# Patient Record
Sex: Female | Born: 2000 | Race: White | Hispanic: No | Marital: Single | Smoking: Never smoker
Health system: Southern US, Community
[De-identification: ages and names within clinical notes are randomized; demographics above are authoritative.]

## PROBLEM LIST (undated history)

## (undated) DIAGNOSIS — T7840XA Allergy, unspecified, initial encounter: Secondary | ICD-10-CM

## (undated) HISTORY — PX: WISDOM TOOTH EXTRACTION: SHX21

## (undated) HISTORY — DX: Allergy, unspecified, initial encounter: T78.40XA

---
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
## 2020-02-17 ENCOUNTER — Ambulatory Visit: Payer: BC Managed Care – PPO | Admitting: Adult Health

## 2021-08-29 ENCOUNTER — Ambulatory Visit
Admission: RE | Admit: 2021-08-29 | Discharge: 2021-08-29 | Disposition: A | Discharge status: Home / Self Care | Payer: BC Managed Care – PPO | Source: Ambulatory Visit | Attending: Pediatrics | Admitting: Pediatrics

## 2021-08-29 ENCOUNTER — Other Ambulatory Visit: Payer: Self-pay | Admitting: Pediatrics

## 2021-08-29 DIAGNOSIS — T1490XA Injury, unspecified, initial encounter: Secondary | ICD-10-CM | POA: Diagnosis present

## 2021-08-29 DIAGNOSIS — R1084 Generalized abdominal pain: Secondary | ICD-10-CM

## 2021-10-12 ENCOUNTER — Emergency Department
Admission: EM | Admit: 2021-10-12 | Discharge: 2021-10-13 | Disposition: A | Discharge status: Home / Self Care | Payer: 59 | Attending: Emergency Medicine | Admitting: Emergency Medicine

## 2021-10-12 ENCOUNTER — Emergency Department: Payer: 59

## 2021-10-12 ENCOUNTER — Encounter: Payer: Self-pay | Admitting: *Deleted

## 2021-10-12 ENCOUNTER — Other Ambulatory Visit: Payer: Self-pay

## 2021-10-12 DIAGNOSIS — R1011 Right upper quadrant pain: Secondary | ICD-10-CM | POA: Diagnosis present

## 2021-10-12 DIAGNOSIS — K529 Noninfective gastroenteritis and colitis, unspecified: Secondary | ICD-10-CM | POA: Insufficient documentation

## 2021-10-12 DIAGNOSIS — E876 Hypokalemia: Secondary | ICD-10-CM | POA: Diagnosis not present

## 2021-10-12 LAB — COMPREHENSIVE METABOLIC PANEL
ALT: 9 U/L (ref 0–44)
AST: 17 U/L (ref 15–41)
Albumin: 4.2 g/dL (ref 3.5–5.0)
Alkaline Phosphatase: 69 U/L (ref 38–126)
Anion gap: 7 (ref 5–15)
BUN: 9 mg/dL (ref 6–20)
CO2: 26 mmol/L (ref 22–32)
Calcium: 9.1 mg/dL (ref 8.9–10.3)
Chloride: 107 mmol/L (ref 98–111)
Creatinine, Ser: 0.77 mg/dL (ref 0.44–1.00)
GFR, Estimated: >60 mL/min (ref >60)
Glucose, Bld: 111 mg/dL — ABNORMAL HIGH (ref 70–99)
Potassium: 3 mmol/L — ABNORMAL LOW (ref 3.5–5.1)
Sodium: 140 mmol/L (ref 135–145)
Total Bilirubin: 0.5 mg/dL (ref 0.3–1.2)
Total Protein: 7.4 g/dL (ref 6.5–8.1)

## 2021-10-12 LAB — CBC WITH DIFFERENTIAL/PLATELET
Abs Immature Granulocytes: 0.03 10*3/uL (ref 0.00–0.07)
Basophils Absolute: 0.1 10*3/uL (ref 0.0–0.1)
Basophils Relative: 1 %
Eosinophils Absolute: 0.1 10*3/uL (ref 0.0–0.5)
Eosinophils Relative: 1 %
HCT: 39.8 % (ref 36.0–46.0)
Hemoglobin: 12.9 g/dL (ref 12.0–15.0)
Immature Granulocytes: 0 %
Lymphocytes Relative: 38 %
Lymphs Abs: 3.7 10*3/uL (ref 0.7–4.0)
MCH: 28.8 pg (ref 26.0–34.0)
MCHC: 32.4 g/dL (ref 30.0–36.0)
MCV: 88.8 fL (ref 80.0–100.0)
Monocytes Absolute: 1.1 10*3/uL — ABNORMAL HIGH (ref 0.1–1.0)
Monocytes Relative: 12 %
Neutro Abs: 4.7 10*3/uL (ref 1.7–7.7)
Neutrophils Relative %: 48 %
Platelets: 308 10*3/uL (ref 150–400)
RBC: 4.48 MIL/uL (ref 3.87–5.11)
RDW: 11.1 % — ABNORMAL LOW (ref 11.5–15.5)
WBC: 9.7 10*3/uL (ref 4.0–10.5)
nRBC: 0 % (ref 0.0–0.2)

## 2021-10-12 LAB — LIPASE, BLOOD: Lipase: 35 U/L (ref 11–51)

## 2021-10-12 LAB — URINALYSIS, ROUTINE W REFLEX MICROSCOPIC
Bilirubin Urine: NEGATIVE
Glucose, UA: NEGATIVE mg/dL
Hgb urine dipstick: NEGATIVE
Ketones, ur: NEGATIVE mg/dL
Leukocytes,Ua: NEGATIVE
Nitrite: NEGATIVE
Protein, ur: NEGATIVE mg/dL
Specific Gravity, Urine: 1.009 (ref 1.005–1.030)
pH: 6 (ref 5.0–8.0)

## 2021-10-12 LAB — POC URINE PREG, ED: Preg Test, Ur: NEGATIVE

## 2021-10-12 LAB — PREGNANCY, URINE: Preg Test, Ur: NEGATIVE

## 2021-10-12 MED ORDER — FAMOTIDINE IN NACL 20-0.9 MG/50ML-% IV SOLN
20.0000 mg | Freq: Once | INTRAVENOUS | Status: AC
  Administered 2021-10-12: 20 mg via INTRAVENOUS
  Filled 2021-10-12: qty 50

## 2021-10-12 MED ORDER — SODIUM CHLORIDE 0.9 % IV BOLUS
1000.0000 mL | Freq: Once | INTRAVENOUS | Status: AC
  Administered 2021-10-12: 1000 mL via INTRAVENOUS

## 2021-10-12 NOTE — ED Provider Notes (Signed)
[facility] Provider Note    Event Date/Time   First MD Initiated Contact with Patient 10/12/21 2300     (approximate)   History   Abdominal Pain   HPI  Melissa Whitehead is a 21 y.o. female who presents to the ER from home literally like accompanied by her mother with a chief complaint.  Patient works right upper quadrant abdominal pain, intermittently x2 weeks.  Describes sharp pain rib cage.  Not associated with food, nausea, vomiting.  Notes 3 episodes of diarrhea this week.  Denies fever, cough, dysuria, vaginal bleeding.  Had a collision at taekwondo several weeks ago where she was in her upper abdomen.  Denies recent travel.     Past Medical History  No past medical history on file.   Active Problem List  There are no problems to display for this patient.    Past Surgical History  None   Home Medications   Prior to Admission medications   Not on File     Allergies  Sevoflurane   Family History  No family history on file.   Physical Exam  Triage Vital Signs: ED Triage Vitals  Enc Vitals Group     BP 10/12/21 1905 113/74     Pulse Rate 10/12/21 1905 72     Resp 10/12/21 1905 20     Temp 10/12/21 1905 98.2 F (36.8 C)     Temp Source 10/12/21 1905 Oral     SpO2 10/12/21 1905 100 %     Weight 10/12/21 1906 135 lb (61.2 kg)     Height 10/12/21 1906 5\' 2"  (1.575 m)     Head Circumference --      Peak Flow --      Pain Score 10/12/21 1906 1     Pain Loc --      Pain Edu? --      Excl. in GC? --     Updated Vital Signs: BP 94/60   Pulse 75   Temp 98.6 F (37 C)   Resp 16   Ht 5\' 2"  (1.575 m)   Wt 61.2 kg   LMP 09/28/2021 (Approximate)   SpO2 98%   BMI 24.69 kg/m    General: Awake, no distress.  CV:  RRR. Good peripheral perfusion.  Resp:  Normal effort.  CTA B. Abd:  Minimal tenderness to palpation right upper quadrant without rebound or guarding. no distention.  Other:  No truncal vesicles.   ED Results  / Procedures / Treatments  Labs (all labs ordered are listed, but only abnormal results are displayed) Labs Reviewed  CBC WITH DIFFERENTIAL/PLATELET - Abnormal; Notable for the following components:      Result Value   RDW 11.1 (*)    Monocytes Absolute 1.1 (*)    All other components within normal limits  COMPREHENSIVE METABOLIC PANEL - Abnormal; Notable for the following components:   Potassium 3.0 (*)    Glucose, Bld 111 (*)    All other components within normal limits  URINALYSIS, ROUTINE W REFLEX MICROSCOPIC - Abnormal; Notable for the following components:   Color, Urine YELLOW (*)    APPearance HAZY (*)    All other components within normal limits  LIPASE, BLOOD  PREGNANCY, URINE  POC URINE PREG, ED     EKG  None   RADIOLOGY I have independently visualized and interpreted patient's ultrasound and CT scans as well as the radiology interpretation:  Ultrasound: Unremarkable  CTA chest: No PE  CT abdomen/pelvis: Enteritis  Official radiology report(s): CT Abdomen Pelvis W Contrast  Result Date: 10/13/2021 CLINICAL DATA:  Insert epic diagnosis and indication EXAM: CT ABDOMEN AND PELVIS WITH CONTRAST TECHNIQUE: Multidetector CT imaging of the abdomen and pelvis was performed using the standard protocol following bolus administration of intravenous contrast. RADIATION DOSE REDUCTION: This exam was performed according to the departmental dose-optimization program which includes automated exposure control, adjustment of the mA and/or kV according to patient size and/or use of iterative reconstruction technique. CONTRAST:  OMNIPAQUE IOHEXOL 350 MG/ML SOLN COMPARISON:  Right upper quadrant ultrasound yesterday. FINDINGS: Lower chest: Assessed on concurrent chest CTA. Hepatobiliary: No focal liver abnormality is seen. No gallstones, gallbladder wall thickening, or biliary dilatation. Pancreas: No ductal dilatation or inflammation. Spleen: Normal in size without focal  abnormality. Adrenals/Urinary Tract: Normal adrenal glands. No hydronephrosis, focal renal abnormality or stone. Minimally distended urinary bladder, equivocal wall thickening versus nondistention. Stomach/Bowel: Stomach is within normal limits. Appendix appears normal. Scattered fluid within small bowel with occasional areas of wall thickening. No perienteric inflammation or edema. Small to moderate volume of colonic stool. Vascular/Lymphatic: No acute vascular findings. Normal caliber abdominal aorta. Patent portal vein. There are multiple prominent ileocolic lymph nodes measuring up to 8 mm short axis. Reproductive: Uterus and bilateral adnexa are unremarkable. Other: No free air, free fluid, or intra-abdominal fluid collection. Musculoskeletal: There are no acute or suspicious osseous abnormalities. Mild scoliosis. IMPRESSION: 1. Scattered fluid within small bowel with occasional areas of wall thickening, suggesting enteritis. 2. Multiple prominent ileocolic lymph nodes are likely reactive. 3. Equivocal bladder wall thickening versus nondistention. Electronically Signed   By: Narda Rutherford M.D.   On: 10/13/2021 01:10   CT Angio Chest PE W/Cm &/Or Wo Cm  Result Date: 10/13/2021 CLINICAL DATA:  Pulmonary embolism (PE) suspected, unknown D-dimer EXAM: CT ANGIOGRAPHY CHEST WITH CONTRAST TECHNIQUE: Multidetector CT imaging of the chest was performed using the standard protocol during bolus administration of intravenous contrast. Multiplanar CT image reconstructions and MIPs were obtained to evaluate the vascular anatomy. Performed in conjunction with CT of the abdomen and pelvis. RADIATION DOSE REDUCTION: This exam was performed according to the departmental dose-optimization program which includes automated exposure control, adjustment of the mA and/or kV according to patient size and/or use of iterative reconstruction technique. CONTRAST:  OMNIPAQUE IOHEXOL 350 MG/ML SOLN COMPARISON:  Chest radiograph  08/29/2021, no interval chest imaging. FINDINGS: Cardiovascular: There are no filling defects within the pulmonary arteries to suggest pulmonary embolus. No aortic dissection or acute aortic findings. Normal heart size. No pericardial effusion. Mediastinum/Nodes: No mediastinal adenopathy or mass. Residual thymus in the anterior mediastinum, not unexpected for age. The esophagus is decompressed. No visible thyroid nodule. Lungs/Pleura: Clear lungs. No focal airspace disease. No pleural effusion. Upper Abdomen: Assessed on concurrent abdominal CT, reported separately. Musculoskeletal: There are no acute or suspicious osseous abnormalities. Mild thoracic scoliosis. No chest wall soft tissue abnormalities. Review of the MIP images confirms the above findings. IMPRESSION: No pulmonary embolus or acute intrathoracic abnormality. Electronically Signed   By: Narda Rutherford M.D.   On: 10/13/2021 01:04   US Abdomen Limited RUQ (LIVER/GB)  Result Date: 10/12/2021 CLINICAL DATA:  [id number] EXAM: ULTRASOUND ABDOMEN LIMITED RIGHT UPPER QUADRANT COMPARISON:  None Available. FINDINGS: Gallbladder: No gallstones or wall thickening visualized. No sonographic Murphy sign noted by sonographer. Common bile duct: Diameter: Visualized portion measures 2 mm, within normal limits Liver: No focal lesion identified. Within normal limits in parenchymal echogenicity. Portal vein is  patent on color Doppler imaging with normal direction of blood flow towards the liver. Other: None. IMPRESSION: Unremarkable RIGHT upper quadrant ultrasound. Electronically Signed   By: Valentino Saxon M.D.   On: 10/12/2021 19:37     PROCEDURES:  Critical Care performed: No  Procedures   MEDICATIONS ORDERED IN ED: Medications  potassium chloride SA (KLOR-CON M) CR tablet 40 mEq (has no administration in time range)  ciprofloxacin (CIPRO) tablet 500 mg (has no administration in time range)  sodium chloride 0.9 % bolus 1,000 mL (0 mLs Intravenous  Stopped 10/13/21 0031)  famotidine (PEPCID) IVPB 20 mg premix (0 mg Intravenous Stopped 10/13/21 0017)  iohexol (OMNIPAQUE) 350 MG/ML injection 100 mL (100 mLs Intravenous Contrast Given 10/13/21 0049)     IMPRESSION / MDM / ASSESSMENT AND PLAN / ED COURSE  I reviewed the triage vital signs and the nursing notes.                             21 year old female presenting with a 2-week history of right upper quadrant abdominal pain. Differential diagnosis includes, but is not limited to, biliary disease (biliary colic, acute cholecystitis, cholangitis, choledocholithiasis, etc), intrathoracic causes for epigastric abdominal pain including ACS, gastritis, duodenitis, pancreatitis, small bowel or large bowel obstruction, abdominal aortic aneurysm, hernia, and ulcer(s).   Patient's presentation is most consistent with acute presentation with potential threat to life or bodily function.  Laboratory results demonstrate normal WBC 9.7, mild hypokalemia potassium 3.0, normal LFTs/lipase, negative urine.  Ultrasound is unremarkable.  Will obtain CTA chest to evaluate for PE and CT abdomen/pelvis to evaluate for intra-abdominal etiology.  Will reassess.  Clinical Course as of 10/13/21 [id number]  Nancy Fetter Oct 13, 2021  0225 Patient resting no acute distress.  Updated patient and mother on negative CTA for PE.  CT abdomen/pelvis demonstrates enteritis.  We discussed these findings and agreed to place patient on a 3-day course of Cipro bid.  Strict return precautions given.  Both verbalized understanding agree with plan of care. [JS]    Clinical Course User Index [JS] Paulette Blanch, MD     FINAL CLINICAL IMPRESSION(S) / ED DIAGNOSES   Final diagnoses:  Right upper quadrant abdominal pain  Hypokalemia  Enteritis     Rx / DC Orders   ED Discharge Orders     None        Note:  This document was prepared using Dragon voice recognition software and may include unintentional dictation errors.   Paulette Blanch, MD 10/13/21 [phone]

## 2021-10-12 NOTE — ED Notes (Signed)
ED Provider at bedside. 

## 2021-10-12 NOTE — ED Provider Triage Note (Signed)
Emergency Medicine Provider Triage Evaluation Note  Melissa Whitehead , a 21 y.o. female  was evaluated in triage.  Pt complains of RUQ pain x2 weeks, it was intermittent, and now constant today. Describes as mild, and worse with truncal rotation. Denies any change in pain with eating.   Review of Systems  Positive: Abd pain,  Negative: N/v/d, fevers  Physical Exam  There were no vitals taken for this visit. Gen:   Awake, no distress   Resp:  Normal effort  MSK:   Moves extremities without difficulty  Other:    Medical Decision Making  Medically screening exam initiated at 7:04 PM.  Appropriate orders placed.  Melissa Whitehead was informed that the remainder of the evaluation will be completed by another provider, this initial triage assessment does not replace that evaluation, and the importance of remaining in the ED until their evaluation is complete.     Jackelyn Hoehn, PA-C 10/12/21 1907

## 2021-10-12 NOTE — ED Triage Notes (Addendum)
Pt has ruq pain for 2 weeks.  Diarrhea x 3 this week  no vomiting.  No back pain .  Denies urinary sx.  No vag bleeding.   Pt alert  speech clear.

## 2021-10-13 ENCOUNTER — Emergency Department: Payer: 59

## 2021-10-13 MED ORDER — POTASSIUM CHLORIDE CRYS ER 20 MEQ PO TBCR
40.0000 meq | EXTENDED_RELEASE_TABLET | Freq: Once | ORAL | Status: AC
  Administered 2021-10-13: 40 meq via ORAL
  Filled 2021-10-13: qty 2

## 2021-10-13 MED ORDER — IOHEXOL 350 MG/ML SOLN
100.0000 mL | Freq: Once | INTRAVENOUS | Status: AC | PRN
  Administered 2021-10-13: 100 mL via INTRAVENOUS

## 2021-10-13 MED ORDER — CIPROFLOXACIN HCL 500 MG PO TABS: 500.0000 mg | ORAL_TABLET | Freq: Two times a day (BID) | ORAL | 0 refills | Status: AC

## 2021-10-13 MED ORDER — CIPROFLOXACIN HCL 500 MG PO TABS
500.0000 mg | ORAL_TABLET | Freq: Once | ORAL | Status: AC
  Administered 2021-10-13: 500 mg via ORAL
  Filled 2021-10-13: qty 1

## 2021-10-13 NOTE — Discharge Instructions (Signed)
1.  Take antibiotic as prescribed (Cipro 500mg  twice daily x3 days). 2.  BRAT diet x3 days, then slowly advance diet as tolerated. 3.  Return to the ER for worsening symptoms, persistent pain, difficulty breathing or other concerns.

## 2021-10-13 NOTE — ED Notes (Signed)
ED Provider at bedside. 

## 2021-10-13 NOTE — ED Notes (Signed)
Pt taken to radiology

## 2022-07-17 ENCOUNTER — Ambulatory Visit: Payer: 59 | Admitting: Family Medicine

## 2022-07-17 ENCOUNTER — Encounter: Payer: Self-pay | Admitting: Family Medicine

## 2022-07-17 ENCOUNTER — Ambulatory Visit: Payer: Self-pay | Admitting: Family Medicine

## 2022-07-17 VITALS — BP 98/67 | HR 71 | Temp 98.7°F | Resp 14 | Ht 62.0 in | Wt 133.2 lb

## 2022-07-17 DIAGNOSIS — G43109 Migraine with aura, not intractable, without status migrainosus: Secondary | ICD-10-CM | POA: Diagnosis not present

## 2022-07-17 DIAGNOSIS — Z7689 Persons encountering health services in other specified circumstances: Secondary | ICD-10-CM | POA: Diagnosis not present

## 2022-07-17 DIAGNOSIS — G8929 Other chronic pain: Secondary | ICD-10-CM

## 2022-07-17 DIAGNOSIS — M25531 Pain in right wrist: Secondary | ICD-10-CM | POA: Diagnosis not present

## 2022-07-17 MED ORDER — BACLOFEN 10 MG PO TABS: 10.0000 mg | ORAL_TABLET | ORAL | 5 refills | Status: DC | PRN

## 2022-07-17 MED ORDER — BACLOFEN 10 MG PO TABS: 10.0000 mg | ORAL_TABLET | ORAL | 5 refills | Status: AC | PRN

## 2022-07-17 NOTE — Progress Notes (Signed)
I,Vanessa  Vital,acting as a Neurosurgeon for Jacky Kindle, FNP.,have documented all relevant documentation on the behalf of Jacky Kindle, FNP,as directed by  Jacky Kindle, FNP while in the presence of Jacky Kindle, FNP.   New patient visit  Patient: Melissa Whitehead   DOB: Nov 20, 2000   22 y.o. Female  MRN: [id number] Visit Date: 07/17/2022  Today's healthcare provider: Jacky Kindle, FNP  Patient presents for new patient visit to establish care.  Introduced to Publishing rights manager role and practice setting.  All questions answered.  Discussed provider/patient relationship and expectations.  Subjective    Melissa Whitehead is a 22 y.o. female who presents today as a new patient to establish care.   HPI  Patient transferring care from [facility] Patient states would like discuss pain in her wrist and migraines she has been experiencing.  Past Medical History:  Diagnosis Date   Allergy     Family Status  Relation Name Status   Mother  Alive   Father  Alive   MGM  Alive   MGF  Alive   PGM  Deceased   PGF  Alive   Family History  Problem Relation Age of Onset   Diverticulosis Mother    Stroke Father    Stroke Maternal Grandmother    Parkinson's disease Maternal Grandfather    Heart attack Maternal Grandfather    Diabetes Paternal Grandmother    Kidney failure Paternal Grandmother    Cancer Paternal Grandmother    Alzheimer's disease Paternal Grandfather    Social History   Socioeconomic History   Marital status: Single    Spouse name: Not on file   Number of children: Not on file   Years of education: Not on file   Highest education level: Not on file  Occupational History   Not on file  Tobacco Use   Smoking status: Never   Smokeless tobacco: Never  Substance and Sexual Activity   Alcohol use: Yes    Comment: occasional   Drug use: Never   Sexual activity: Not Currently    Birth control/protection: None  Other Topics Concern   Not on file   Social History Narrative   Not on file   Social Determinants of Health   Financial Resource Strain: Not on file  Food Insecurity: Not on file  Transportation Needs: Not on file  Physical Activity: Not on file  Stress: Not on file  Social Connections: Not on file   Outpatient Medications Prior to Visit  Medication Sig   meclizine (ANTIVERT) 25 MG tablet Take 25 mg by mouth as needed (Only when experiencing vertigo symptoms).   [DISCONTINUED] baclofen (LIORESAL) 10 MG tablet Take 1 tablet by mouth as needed (ADHD).   [DISCONTINUED] lisdexamfetamine (VYVANSE) 20 MG capsule Take 1 capsule by mouth every morning. As needed   No facility-administered medications prior to visit.   Allergies  Allergen Reactions   Sevoflurane Other (See Comments)    Other reaction(s): Unknown Dad had malignant hyperthermia with this drug.     There is no immunization history on file for this patient.  Health Maintenance  Topic Date Due   COVID-19 Vaccine (1) Never done   HPV VACCINES (1 - 2-dose series) Never done   HIV Screening  Never done   Hepatitis C Screening  Never done   DTaP/Tdap/Td (1 - Tdap) Never done   PAP-Cervical Cytology Screening  Never done   PAP SMEAR-Modifier  Never done  INFLUENZA VACCINE  10/09/2022    Patient Care Team: Jacky Kindle, FNP as PCP - General (Family Medicine)  Review of Systems  HENT:  Positive for tinnitus.   Eyes:  Positive for photophobia, pain and visual disturbance.  Neurological:  Positive for headaches.  All other systems reviewed and are negative.   Objective    BP 98/67 (BP Location: Right Arm, Patient Position: Sitting, Cuff Size: Normal)   Pulse 71   Temp 98.7 F (37.1 C) (Oral)   Resp 14   Ht 5\' 2"  (1.575 m)   Wt 133 lb 3.2 oz (60.4 kg)   LMP 06/27/2022 (Approximate)   SpO2 98%   BMI 24.36 kg/m   Physical Exam Vitals and nursing note reviewed.  Constitutional:      General: She is awake. She is not in acute distress.     Appearance: Normal appearance. She is well-developed, well-groomed and normal weight. She is not ill-appearing, toxic-appearing or diaphoretic.  HENT:     Head: Normocephalic and atraumatic.     Jaw: There is normal jaw occlusion. No trismus, tenderness, swelling or pain on movement.     Right Ear: Hearing, tympanic membrane, ear canal and external ear normal. There is no impacted cerumen.     Left Ear: Hearing, tympanic membrane, ear canal and external ear normal. There is no impacted cerumen.     Nose: Nose normal. No congestion or rhinorrhea.     Right Turbinates: Not enlarged, swollen or pale.     Left Turbinates: Not enlarged, swollen or pale.     Right Sinus: No maxillary sinus tenderness or frontal sinus tenderness.     Left Sinus: No maxillary sinus tenderness or frontal sinus tenderness.     Mouth/Throat:     Lips: Pink.     Mouth: Mucous membranes are moist. No injury.     Tongue: No lesions.     Pharynx: Oropharynx is clear. Uvula midline. No pharyngeal swelling, oropharyngeal exudate, posterior oropharyngeal erythema or uvula swelling.     Tonsils: No tonsillar exudate or tonsillar abscesses.  Eyes:     General: Lids are normal. Lids are everted, no foreign bodies appreciated. Vision grossly intact. Gaze aligned appropriately. No allergic shiner or visual field deficit.       Right eye: No discharge.        Left eye: No discharge.     Extraocular Movements: Extraocular movements intact.     Conjunctiva/sclera: Conjunctivae normal.     Right eye: Right conjunctiva is not injected. No exudate.    Left eye: Left conjunctiva is not injected. No exudate.    Pupils: Pupils are equal, round, and reactive to light.  Neck:     Thyroid: No thyroid mass, thyromegaly or thyroid tenderness.     Vascular: No carotid bruit.     Trachea: Trachea normal.  Cardiovascular:     Rate and Rhythm: Normal rate and regular rhythm.     Pulses: Normal pulses.          Carotid pulses are 2+ on the  right side and 2+ on the left side.      Radial pulses are 2+ on the right side and 2+ on the left side.       Dorsalis pedis pulses are 2+ on the right side and 2+ on the left side.       Posterior tibial pulses are 2+ on the right side and 2+ on the left side.     Heart  sounds: Normal heart sounds, S1 normal and S2 normal. No murmur heard.    No friction rub. No gallop.  Pulmonary:     Effort: Pulmonary effort is normal. No respiratory distress.     Breath sounds: Normal breath sounds and air entry. No stridor. No wheezing, rhonchi or rales.  Chest:     Chest wall: No tenderness.  Abdominal:     General: Abdomen is flat. Bowel sounds are normal. There is no distension.     Palpations: Abdomen is soft. There is no mass.     Tenderness: There is no abdominal tenderness. There is no right CVA tenderness, left CVA tenderness, guarding or rebound.     Hernia: No hernia is present.  Genitourinary:    Comments: Exam deferred; denies complaints Musculoskeletal:        General: No swelling, tenderness, deformity or signs of injury. Normal range of motion.     Cervical back: Full passive range of motion without pain, normal range of motion and neck supple. No edema, rigidity or tenderness. No muscular tenderness.     Right lower leg: No edema.     Left lower leg: No edema.  Lymphadenopathy:     Cervical: No cervical adenopathy.     Right cervical: No superficial, deep or posterior cervical adenopathy.    Left cervical: No superficial, deep or posterior cervical adenopathy.  Skin:    General: Skin is warm and dry.     Capillary Refill: Capillary refill takes less than 2 seconds.     Coloration: Skin is not jaundiced or pale.     Findings: No bruising, erythema, lesion or rash.  Neurological:     General: No focal deficit present.     Mental Status: She is alert and oriented to person, place, and time. Mental status is at baseline.     GCS: GCS eye subscore is 4. GCS verbal subscore is 5.  GCS motor subscore is 6.     Cranial Nerves: No cranial nerve deficit.     Sensory: Sensation is intact. No sensory deficit.     Motor: Motor function is intact. No weakness.     Coordination: Coordination is intact. Coordination normal.     Gait: Gait is intact. Gait normal.  Psychiatric:        Attention and Perception: Attention and perception normal.        Mood and Affect: Mood and affect normal.        Speech: Speech normal.        Behavior: Behavior normal. Behavior is cooperative.        Thought Content: Thought content normal.        Cognition and Memory: Cognition and memory normal.        Judgment: Judgment normal.     Depression Screen    07/17/2022   11:20 AM  PHQ 2/9 Scores  PHQ - 2 Score 0  PHQ- 9 Score 0   No results found for any visits on 07/17/22.  Assessment & Plan      Problem List Items Addressed This Visit       Cardiovascular and Mediastinum   Migraine with aura and without status migrainosus, not intractable - Primary    Chronic, reports 1-4 migraines per month Associated with stress and relieved with PRN baclofen Previously seen by neurology under the care of [facility] and Potter MD Low suspicion for chronic, intractable migraine which would require daily medication or change in preventative Endorses some tinnitus  involvement in the setting of seasonal allergies; controlled with OTC medications and previous Rx of meclizine       Relevant Medications   baclofen (LIORESAL) 10 MG tablet     Other   Chronic pain of right wrist    Overuse injury; recently exacerbated x1 day Denies pain at site at this time; however, previously injury in martial arts injury and was seen by [facility]; pt has established care there and wishes to continue care Discussed use of braces to support with repetitive use as well as topical NSAID to assist with pain No ROM deficits noted No swelling or edema present Continue to monitor with supportive care measures and  current specialist       Relevant Medications   baclofen (LIORESAL) 10 MG tablet   Encounter to establish care    [employer] student in [employer] and [place]; 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  Things to do to keep yourself healthy  - Exercise at least 30-45 minutes a day, 3-4 days a week.  - Eat a low-fat diet with lots of fruits and vegetables, up to 7-9 servings per day.  - Seatbelts can save your life. Wear them always.  - Smoke detectors on every level of your home, check batteries every year.  - Eye Doctor - have an eye exam every 1-2 years  - Safe sex - if you may be exposed to STDs, use a condom.  - Alcohol -  If you drink, do it moderately, less than 2 drinks per day.  - Health Care Power of Attorney. Choose someone to speak for you if you are not able.  - Depression is common in our stressful world.If you're feeling down or losing interest in things you normally enjoy, please come in for a visit.  - Violence - If anyone is threatening or hurting you, please call immediately.       Return in about 1 year (around 07/17/2023) for annual examination.    Leilani Merl, FNP, have reviewed all documentation for this visit. The documentation on 07/17/22 for the exam, diagnosis, procedures, and orders are all accurate and complete.  Jacky Kindle, FNP  [facility] [phone] (phone) [phone] (fax)  [facility]

## 2022-07-17 NOTE — Assessment & Plan Note (Signed)
Overuse injury; recently exacerbated x1 day Denies pain at site at this time; however, previously injury in martial arts injury and was seen by [facility]; pt has established care there and wishes to continue care Discussed use of braces to support with repetitive use as well as topical NSAID to assist with pain No ROM deficits noted No swelling or edema present Continue to monitor with supportive care measures and current specialist

## 2022-07-17 NOTE — Assessment & Plan Note (Signed)
Chronic, reports 1-4 migraines per month Associated with stress and relieved with PRN baclofen Previously seen by neurology under the care of [facility] and Malvin Johns MD Low suspicion for chronic, intractable migraine which would require daily medication or change in preventative Endorses some tinnitus involvement in the setting of seasonal allergies; controlled with OTC medications and previous Rx of meclizine

## 2022-07-17 NOTE — Assessment & Plan Note (Signed)
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  Things to do to keep yourself healthy  - Exercise at least 30-45 minutes a day, 3-4 days a week.  - Eat a low-fat diet with lots of fruits and vegetables, up to 7-9 servings per day.  - Seatbelts can save your life. Wear them always.  - Smoke detectors on every level of your home, check batteries every year.  - Eye Doctor - have an eye exam every 1-2 years  - Safe sex - if you may be exposed to STDs, use a condom.  - Alcohol -  If you drink, do it moderately, less than 2 drinks per day.  - Health Care Power of Attorney. Choose someone to speak for you if you are not able.  - Depression is common in our stressful world.If you're feeling down or losing interest in things you normally enjoy, please come in for a visit.  - Violence - If anyone is threatening or hurting you, please call immediately.

## 2022-08-01 ENCOUNTER — Ambulatory Visit: Payer: Self-pay | Admitting: Family Medicine

## 2022-10-09 ENCOUNTER — Ambulatory Visit
Admission: RE | Admit: 2022-10-09 | Discharge: 2022-10-09 | Disposition: A | Discharge status: Home / Self Care | Payer: 59 | Source: Ambulatory Visit | Attending: Family Medicine | Admitting: Family Medicine

## 2022-10-09 ENCOUNTER — Encounter: Payer: Self-pay | Admitting: Family Medicine

## 2022-10-09 ENCOUNTER — Ambulatory Visit: Payer: 59 | Admitting: Family Medicine

## 2022-10-09 VITALS — BP 106/67 | HR 69 | Ht 62.0 in | Wt 137.5 lb

## 2022-10-09 DIAGNOSIS — R1011 Right upper quadrant pain: Secondary | ICD-10-CM | POA: Insufficient documentation

## 2022-10-09 DIAGNOSIS — Z833 Family history of diabetes mellitus: Secondary | ICD-10-CM | POA: Diagnosis not present

## 2022-10-09 DIAGNOSIS — Z114 Encounter for screening for human immunodeficiency virus [HIV]: Secondary | ICD-10-CM

## 2022-10-09 DIAGNOSIS — Z1159 Encounter for screening for other viral diseases: Secondary | ICD-10-CM

## 2022-10-09 DIAGNOSIS — Z823 Family history of stroke: Secondary | ICD-10-CM

## 2022-10-09 NOTE — Assessment & Plan Note (Signed)
Recommend LP recommend diet low in saturated fat and regular exercise - 30 min at least 5 times per week

## 2022-10-09 NOTE — Assessment & Plan Note (Signed)
Low risk screen ?Consented; encouraged to "know your status" ?Recommend repeat screen if risk factors change ? ?

## 2022-10-09 NOTE — Progress Notes (Signed)
Established patient visit   Patient: Melissa Whitehead   DOB: 2000-05-22   22 y.o. Female  MRN: [id number] Visit Date: 10/09/2022  Today's healthcare provider: Jacky Kindle, FNP  Introduced to nurse practitioner role and practice setting.  All questions answered.  Discussed provider/patient relationship and expectations.  Subjective    HPI HPI     Pain    Additional comments: Patient reports pain under rib cage on right side      Last edited by Acey Lav, CMA on 10/09/2022 10:06 AM.      Medications: Outpatient Medications Prior to Visit  Medication Sig   baclofen (LIORESAL) 10 MG tablet Take 1 tablet (10 mg total) by mouth as needed.   meclizine (ANTIVERT) 25 MG tablet Take 25 mg by mouth as needed (Only when experiencing vertigo symptoms).   No facility-administered medications prior to visit.      Objective    BP 106/67 (BP Location: Right Arm, Patient Position: Sitting, Cuff Size: Normal)   Pulse 69   Ht 5\' 2"  (1.575 m)   Wt 137 lb 8 oz (62.4 kg)   SpO2 99%   BMI 25.15 kg/m   Physical Exam Vitals and nursing note reviewed.  Constitutional:      General: She is not in acute distress.    Appearance: Normal appearance. She is normal weight. She is not ill-appearing, toxic-appearing or diaphoretic.  HENT:     Head: Normocephalic and atraumatic.  Cardiovascular:     Rate and Rhythm: Normal rate and regular rhythm.     Pulses: Normal pulses.     Heart sounds: Normal heart sounds. No murmur heard.    No friction rub. No gallop.  Pulmonary:     Effort: Pulmonary effort is normal. No respiratory distress.     Breath sounds: Normal breath sounds. No stridor. No wheezing, rhonchi or rales.  Chest:     Chest wall: No tenderness.  Abdominal:     General: Bowel sounds are normal. There is no distension.     Palpations: Abdomen is soft.     Tenderness: There is abdominal tenderness. There is guarding and rebound. There is no right CVA tenderness or left CVA  tenderness.  Musculoskeletal:        General: No swelling, tenderness, deformity or signs of injury. Normal range of motion.     Right lower leg: No edema.     Left lower leg: No edema.  Skin:    General: Skin is warm and dry.     Capillary Refill: Capillary refill takes less than 2 seconds.     Coloration: Skin is not jaundiced or pale.     Findings: No bruising, erythema, lesion or rash.  Neurological:     General: No focal deficit present.     Mental Status: She is alert and oriented to person, place, and time. Mental status is at baseline.     Cranial Nerves: No cranial nerve deficit.     Sensory: No sensory deficit.     Motor: No weakness.     Coordination: Coordination normal.  Psychiatric:        Mood and Affect: Mood normal.        Behavior: Behavior normal.        Thought Content: Thought content normal.        Judgment: Judgment normal.      No results found for any visits on 10/09/22.  Assessment & Plan     Problem List  Items Addressed This Visit       Other   Encounter for hepatitis C screening test for low risk patient    Low risk screen Treatable, and curable. If left untreated Hep C can lead to cirrhosis and liver failure. Encourage routine testing; recommend repeat testing if risk factors change.       Relevant Orders   Hepatitis C Antibody   Encounter for screening for HIV    Low risk screen Consented; encouraged to "know your status" Recommend repeat screen if risk factors change       Relevant Orders   HIV antibody (with reflex)   Family history of diabetes mellitus    Recommend A1c Continue to recommend balanced, lower carb meals. Smaller meal size, adding snacks. Choosing water as drink of choice and increasing purposeful exercise.       Relevant Orders   Hemoglobin A1c   Family history of stroke    Recommend LP recommend diet low in saturated fat and regular exercise - 30 min at least 5 times per week       Relevant Orders    Lipid panel   RUQ cramping - Primary    Acute, recurrent Reports some soft Bms Recommend [place] and labs Self limiting in terms of pain Continue to monitor Recommend bland diet and small/frequent meals      Relevant Orders   CBC with Differential/Platelet   Comprehensive Metabolic Panel (CMET)   Lipase   US Abdomen Limited RUQ (LIVER/GB)   Return if symptoms worsen or fail to improve.     Leilani Merl, FNP, have reviewed all documentation for this visit. The documentation on 10/09/22 for the exam, diagnosis, procedures, and orders are all accurate and complete.  Jacky Kindle, FNP  [facility] [phone] (phone) [phone] (fax)  [facility]

## 2022-10-09 NOTE — Assessment & Plan Note (Signed)
Acute, recurrent Reports some soft Bms Recommend [place] and labs Self limiting in terms of pain Continue to monitor Recommend bland diet and small/frequent meals

## 2022-10-09 NOTE — Assessment & Plan Note (Signed)
Low risk screen Treatable, and curable. If left untreated Hep C can lead to cirrhosis and liver failure. Encourage routine testing; recommend repeat testing if risk factors change.  

## 2022-10-09 NOTE — Assessment & Plan Note (Signed)
Recommend A1c Continue to recommend balanced, lower carb meals. Smaller meal size, adding snacks. Choosing water as drink of choice and increasing purposeful exercise.  

## 2022-10-16 ENCOUNTER — Encounter: Payer: Self-pay | Admitting: Family Medicine

## 2022-10-16 ENCOUNTER — Ambulatory Visit: Payer: 59 | Admitting: Family Medicine

## 2022-10-16 VITALS — BP 103/67 | HR 113 | Temp 98.4°F | Ht 62.0 in | Wt 140.5 lb

## 2022-10-16 DIAGNOSIS — J039 Acute tonsillitis, unspecified: Secondary | ICD-10-CM | POA: Insufficient documentation

## 2022-10-16 MED ORDER — AMOXICILLIN-POT CLAVULANATE 875-125 MG PO TABS: 1.0000 | ORAL_TABLET | Freq: Two times a day (BID) | ORAL | 0 refills | Status: DC

## 2022-10-16 NOTE — Assessment & Plan Note (Signed)
Acute, x 1-2 days With Fevers and Malaise; taking NSAIDs Was at an event on Friday last week with lots of exposure to children POC COVID negative at home Unable to swollen certain foods d/t pain Recommend ABX and supportive care; continue NSAIDs/APAP as needed

## 2022-10-16 NOTE — Progress Notes (Signed)
Established patient visit   Patient: Melissa Whitehead   DOB: 11-20-2000   22 y.o. Female  MRN: [id number] Visit Date: 10/16/2022  Today's healthcare provider: Jacky Kindle, FNP  Introduced to nurse practitioner role and practice setting.  All questions answered.  Discussed provider/patient relationship and expectations.  Subjective    HPI HPI     Medical Management of Chronic Issues    Additional comments: Pain located in the right ear and throat, migraines that feel like it was in the left eye and woke up with the symptoms. Prior treatment ibuprofen, everything happened yesterday,muscles ache, took covid test, NEGATIVE      Last edited by Rolly Salter, CMA on 10/16/2022  1:13 PM.      Medications: Outpatient Medications Prior to Visit  Medication Sig   baclofen (LIORESAL) 10 MG tablet Take 1 tablet (10 mg total) by mouth as needed.   meclizine (ANTIVERT) 25 MG tablet Take 25 mg by mouth as needed (Only when experiencing vertigo symptoms).   No facility-administered medications prior to visit.     Objective    BP 103/67 (BP Location: Right Arm, Patient Position: Sitting, Cuff Size: Normal)   Pulse (!) 113   Temp 98.4 F (36.9 C) (Oral)   Ht 5\' 2"  (1.575 m)   Wt 140 lb 8 oz (63.7 kg)   SpO2 100%   BMI 25.70 kg/m   Physical Exam Vitals and nursing note reviewed.  Constitutional:      General: She is not in acute distress.    Appearance: Normal appearance. She is overweight. She is ill-appearing. She is not toxic-appearing or diaphoretic.  HENT:     Head: Normocephalic and atraumatic.     Right Ear: Tympanic membrane normal. Swelling and tenderness present.     Left Ear: Tympanic membrane, ear canal and external ear normal.     Nose: Nose normal.     Mouth/Throat:     Mouth: Mucous membranes are moist.     Pharynx: Oropharyngeal exudate and posterior oropharyngeal erythema present.     Tonsils: Tonsillar exudate present. 4+ on the right. 3+ on the left.    Cardiovascular:     Rate and Rhythm: Normal rate and regular rhythm.     Pulses: Normal pulses.     Heart sounds: Normal heart sounds. No murmur heard.    No friction rub. No gallop.  Pulmonary:     Effort: Pulmonary effort is normal. No respiratory distress.     Breath sounds: Normal breath sounds. No stridor. No wheezing, rhonchi or rales.  Chest:     Chest wall: No tenderness.  Musculoskeletal:        General: No swelling, tenderness, deformity or signs of injury. Normal range of motion.     Right lower leg: No edema.     Left lower leg: No edema.  Skin:    General: Skin is warm and dry.     Capillary Refill: Capillary refill takes less than 2 seconds.     Coloration: Skin is not jaundiced or pale.     Findings: No bruising, erythema, lesion or rash.  Neurological:     General: No focal deficit present.     Mental Status: She is alert and oriented to person, place, and time. Mental status is at baseline.     Cranial Nerves: No cranial nerve deficit.     Sensory: No sensory deficit.     Motor: No weakness.     Coordination:  Coordination normal.  Psychiatric:        Mood and Affect: Mood normal.        Behavior: Behavior normal.        Thought Content: Thought content normal.        Judgment: Judgment normal.     No results found for any visits on 10/16/22.  Assessment & Plan     Problem List Items Addressed This Visit       Respiratory   Tonsillitis with exudate - Primary    Acute, x 1-2 days With Fevers and Malaise; taking NSAIDs Was at an event on Friday last week with lots of exposure to children POC COVID negative at home Unable to swollen certain foods d/t pain Recommend ABX and supportive care; continue NSAIDs/APAP as needed      Return if symptoms worsen or fail to improve.     Leilani Merl, FNP, have reviewed all documentation for this visit. The documentation on 10/16/22 for the exam, diagnosis, procedures, and orders are all accurate and  complete.  Jacky Kindle, FNP  [facility] [phone] (phone) [phone] (fax)  [facility]

## 2023-02-27 ENCOUNTER — Ambulatory Visit: Payer: Self-pay

## 2023-02-27 ENCOUNTER — Telehealth: Payer: Self-pay | Admitting: Family Medicine

## 2023-02-27 ENCOUNTER — Encounter: Payer: Self-pay | Admitting: Family Medicine

## 2023-02-27 ENCOUNTER — Telehealth: Payer: 59 | Admitting: Family Medicine

## 2023-02-27 DIAGNOSIS — B950 Streptococcus, group A, as the cause of diseases classified elsewhere: Secondary | ICD-10-CM

## 2023-02-27 DIAGNOSIS — J029 Acute pharyngitis, unspecified: Secondary | ICD-10-CM

## 2023-02-27 NOTE — Telephone Encounter (Signed)
    Chief Complaint: Sore throat. No availability for in office. VV appointment made. Symptoms: Above Frequency: Today Pertinent Negatives: Patient denies any other symptoms Disposition: [] ED /[] Urgent Care (no appt availability in office) / [x] Appointment(In office/virtual)/ []  [place] [facility]/ [] Home Care/ [] Refused Recommended Disposition /[] [place] [facility]/ []  Follow-up with PCP Additional Notes: Agrees with appointment.  Reason for Disposition  SEVERE (e.g., excruciating) throat pain  Answer Assessment - Initial Assessment Questions 1. ONSET: "When did the throat start hurting?" (Hours or days ago)      Today 2. SEVERITY: "How bad is the sore throat?" (Scale 1-10; mild, moderate or severe)   - MILD (1-3):  Doesn't interfere with eating or normal activities.   - MODERATE (4-7): Interferes with eating some solids and normal activities.   - SEVERE (8-10):  Excruciating pain, interferes with most normal activities.   - SEVERE WITH DYSPHAGIA (10): Can't swallow liquids, drooling.     Severe 3. STREP EXPOSURE: "Has there been any exposure to strep within the past week?" If Yes, ask: "What type of contact occurred?"      No 4.  VIRAL SYMPTOMS: "Are there any symptoms of a cold, such as a runny nose, cough, hoarse voice or red eyes?"      No 5. FEVER: "Do you have a fever?" If Yes, ask: "What is your temperature, how was it measured, and when did it start?"     No 6. PUS ON THE TONSILS: "Is there pus on the tonsils in the back of your throat?"     No 7. OTHER SYMPTOMS: "Do you have any other symptoms?" (e.g., difficulty breathing, headache, rash)     No 8. PREGNANCY: "Is there any chance you are pregnant?" "When was your last menstrual period?"     No  Protocols used: Sore Throat-A-AH

## 2023-02-27 NOTE — Telephone Encounter (Signed)
Pts mother is calling "disappointed" that she has paid $30 for a strept test with no rapid results. Pts mother would like to know if sx get worse over the weekend what should they do. And what medications will be prescribed ? When will results return? Please advise CB- [phone]

## 2023-02-27 NOTE — Progress Notes (Signed)
    MyChart Video Visit    Virtual Visit via Video Note   This format is felt to be most appropriate for this patient at this time. Physical exam was limited by quality of the video and audio technology used for the visit.    Patient location: home Provider location: [facility] Persons involved in the visit: patient, provider  I discussed the limitations of evaluation and management by telemedicine and the availability of in person appointments. The patient expressed understanding and agreed to proceed.  Patient: Melissa Whitehead   DOB: 17-Mar-2000   22 y.o. Female  MRN: [id number] Visit Date: 02/27/2023  Today's healthcare provider: Shirlee Latch, MD   No chief complaint on file.  Subjective    HPI   Discussed the use of AI scribe software for clinical note transcription with the patient, who gave verbal consent to proceed.  History of Present Illness   [name] presents with a sore throat that woke them up. They describe the pain as obstructive and painful to swallow, which improved after taking three ibuprofen. They deny fever and palpable lymphadenopathy. They have not noticed any spots in the back of their throat. 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        Review of Systems      Objective    There were no vitals taken for this visit.      Physical Exam Constitutional:      General: She is not in acute distress.    Appearance: Normal appearance.  HENT:     Head: Normocephalic.  Pulmonary:     Effort: Pulmonary effort is normal. No respiratory distress.  Neurological:     Mental Status: She is alert and oriented to person, place, and time. Mental status is at baseline.        Assessment & Plan     Problem List Items Addressed This Visit   None Visit Diagnoses       Sore throat    -  Primary   Relevant Orders   Culture, Group A Strep           Acute  Pharyngitis Acute sore throat with onset this morning, improved with ibuprofen. No fever, exudates, or significant lymphadenopathy. Likely viral etiology, but differential includes streptococcal pharyngitis due to recent school exposure. Discussed low likelihood of strep given clinical presentation. Patient prefers throat swab for confirmation. - Order throat swab for strep test - Advise ibuprofen as needed for pain - Recommend Chloraseptic throat spray for symptomatic relief - Advise salt water gargles - Encourage hydration - Instruct on infection prevention (hand hygiene, avoiding sharing drinks) - Initiate antibiotics if strep test is positive.        No orders of the defined types were placed in this encounter.    Return if symptoms worsen or fail to improve.     I discussed the assessment and treatment plan with the patient. The patient was provided an opportunity to ask questions and all were answered. The patient agreed with the plan and demonstrated an understanding of the instructions.   The patient was advised to call back or seek an in-person evaluation if the symptoms worsen or if the condition fails to improve as anticipated.   Shirlee Latch, MD [facility] [phone] (phone) [phone] (fax)  [facility]

## 2023-03-03 LAB — CULTURE, GROUP A STREP: Strep A Culture: NEGATIVE

## 2023-03-03 LAB — SPECIMEN STATUS REPORT

## 2023-03-05 NOTE — Progress Notes (Signed)
Throat Culture - Negative for Strep A.

## 2023-10-08 ENCOUNTER — Ambulatory Visit: Admitting: Family Medicine

## 2023-10-08 ENCOUNTER — Encounter: Payer: Self-pay | Admitting: Family Medicine

## 2023-10-08 VITALS — BP 95/66 | HR 78 | Temp 98.1°F | Resp 16 | Ht 61.0 in | Wt 131.6 lb

## 2023-10-08 DIAGNOSIS — Z6824 Body mass index (BMI) 24.0-24.9, adult: Secondary | ICD-10-CM | POA: Insufficient documentation

## 2023-10-08 DIAGNOSIS — M25372 Other instability, left ankle: Secondary | ICD-10-CM | POA: Diagnosis not present

## 2023-10-08 DIAGNOSIS — G43109 Migraine with aura, not intractable, without status migrainosus: Secondary | ICD-10-CM

## 2023-10-08 DIAGNOSIS — M778 Other enthesopathies, not elsewhere classified: Secondary | ICD-10-CM | POA: Insufficient documentation

## 2023-10-08 DIAGNOSIS — F900 Attention-deficit hyperactivity disorder, predominantly inattentive type: Secondary | ICD-10-CM | POA: Insufficient documentation

## 2023-10-08 DIAGNOSIS — Z2821 Immunization not carried out because of patient refusal: Secondary | ICD-10-CM

## 2023-10-08 NOTE — Assessment & Plan Note (Signed)
 Chronic, stable Was seen by [facility] It was ruled out to not be carpal tunnel Wears night splints Use ice to affected when inflammed

## 2023-10-08 NOTE — Assessment & Plan Note (Signed)
 Chronic, stable today Pt plans to start exercising again Wear supportive foot wear Referral to PT if worsens

## 2023-10-08 NOTE — Assessment & Plan Note (Signed)
 Chronic, stable Hx of seeing Neuro Baclofen  prn for migraines Prn Meclizine for nausea associated with migraine from neuro's work up per pt

## 2023-10-08 NOTE — Progress Notes (Signed)
 New Patient Office Visit  Introduced to nurse practitioner role and practice setting.  All questions answered.  Discussed provider/patient relationship and expectations.   Subjective    Patient ID: Melissa Whitehead, female    DOB: June 14, 2000  Age: 23 y.o. MRN: [id number]  CC:  Chief Complaint  Patient presents with   Transitions Of Care   HPI  Presents to establish care.   Hx of ADHD, migraines, L ankle instability, and BUE tendinitis   Migraines - managed, uses prn baclofen  for migraines   ADHD - used to take vyvanse while in school. No longer uses at this time. Made her feel robotic and stale. Inattentive, per pt.  Bilateral Upper extremity Tendinitis  - wears night splints  Outpatient Encounter Medications as of 10/08/2023  Medication Sig   baclofen  (LIORESAL ) 10 MG tablet Take 1 tablet (10 mg total) by mouth as needed.   meclizine (ANTIVERT) 25 MG tablet Take 25 mg by mouth as needed (Only when experiencing vertigo symptoms).   No facility-administered encounter medications on file as of 10/08/2023.    Past Medical History:  Diagnosis Date   Allergy     Past Surgical History:  Procedure Laterality Date   WISDOM TOOTH EXTRACTION Bilateral    all four    Family History  Problem Relation Age of Onset   Diverticulosis Mother    Stroke Father    Stroke Maternal Grandmother    Parkinson's disease Maternal Grandfather    Heart attack Maternal Grandfather    Diabetes Paternal Grandmother    Kidney failure Paternal Grandmother    Cancer Paternal Grandmother    Alzheimer's disease Paternal Grandfather     Social History   Socioeconomic History   Marital status: Single    Spouse name: Not on file   Number of children: Not on file   Years of education: Not on file   Highest education level: Not on file  Occupational History   Not on file  Tobacco Use   Smoking status: Never   Smokeless tobacco: Never  Substance and Sexual Activity   Alcohol use: Yes     Comment: occasional   Drug use: Never   Sexual activity: Not Currently    Birth control/protection: None  Other Topics Concern   Not on file  Social History Narrative   Not on file   Social Drivers of Health   Financial Resource Strain: Not on file  Food Insecurity: Not on file  Transportation Needs: Not on file  Physical Activity: Not on file  Stress: Not on file  Social Connections: Not on file  Intimate Partner Violence: Not on file    Review of Systems  All other systems reviewed and are negative.       Objective    BP 95/66 (BP Location: Left Arm, Patient Position: Sitting, Cuff Size: Normal)   Pulse 78   Temp 98.1 F (36.7 C) (Oral)   Resp 16   Ht 5' 1 (1.549 m)   Wt 131 lb 9.6 oz (59.7 kg)   LMP 09/04/2023 (Approximate)   SpO2 100%   BMI 24.87 kg/m   Physical Exam Constitutional:      General: She is awake. She is not in acute distress.    Appearance: Normal appearance. She is well-developed and normal weight. She is not ill-appearing, toxic-appearing or diaphoretic.  HENT:     Head: Normocephalic.     Right Ear: Tympanic membrane normal.     Left Ear: Tympanic membrane normal.  Nose: Nose normal.     Mouth/Throat:     Mouth: Mucous membranes are moist.     Dentition: Dental caries present.     Pharynx: Oropharynx is clear. No oropharyngeal exudate or posterior oropharyngeal erythema.  Eyes:     General: Lids are normal.     Extraocular Movements: Extraocular movements intact.     Right eye: Normal extraocular motion.     Left eye: Normal extraocular motion.     Conjunctiva/sclera: Conjunctivae normal.     Right eye: Right conjunctiva is not injected.     Left eye: Left conjunctiva is not injected.     Pupils: Pupils are equal, round, and reactive to light.  Neck:     Thyroid: No thyroid mass, thyromegaly or thyroid tenderness.  Cardiovascular:     Rate and Rhythm: Normal rate and regular rhythm.     Pulses: Normal pulses.          Radial  pulses are 2+ on the right side and 2+ on the left side.       Posterior tibial pulses are 2+ on the right side and 2+ on the left side.     Heart sounds: Normal heart sounds, S1 normal and S2 normal. No murmur heard.    No friction rub. No gallop.  Pulmonary:     Effort: Pulmonary effort is normal. No respiratory distress.     Breath sounds: Normal breath sounds. No stridor. No wheezing, rhonchi or rales.  Chest:     Chest wall: No tenderness.  Abdominal:     General: Bowel sounds are normal. There is no distension.     Palpations: Abdomen is soft. There is no mass.     Tenderness: There is no abdominal tenderness. There is no right CVA tenderness, left CVA tenderness, guarding or rebound.     Hernia: No hernia is present.  Musculoskeletal:        General: No swelling or tenderness. Normal range of motion.     Cervical back: Normal range of motion. No rigidity.     Right lower leg: No edema.     Left lower leg: No edema.  Lymphadenopathy:     Cervical: No cervical adenopathy.     Right cervical: No superficial, deep or posterior cervical adenopathy.    Left cervical: No superficial, deep or posterior cervical adenopathy.  Skin:    General: Skin is warm and dry.     Capillary Refill: Capillary refill takes less than 2 seconds.     Findings: No bruising or erythema.  Neurological:     General: No focal deficit present.     Mental Status: She is alert and oriented to person, place, and time. Mental status is at baseline.     GCS: GCS eye subscore is 4. GCS verbal subscore is 5. GCS motor subscore is 6.     Cranial Nerves: No cranial nerve deficit.     Sensory: No sensory deficit.     Motor: No weakness, tremor or pronator drift.     Coordination: Romberg sign negative.     Gait: Gait is intact. Gait normal.  Psychiatric:        Attention and Perception: Perception normal. She is inattentive.        Mood and Affect: Mood normal. Affect is blunt and flat.        Speech: Speech is  tangential.        Behavior: Behavior is hyperactive. Behavior is cooperative.  Thought Content: Thought content normal.        Cognition and Memory: Cognition and memory normal.        Judgment: Judgment normal.         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 Pt plans to start exercising again Wear supportive foot wear Referral to PT if worsens   Attention deficit hyperactivity disorder (ADHD), predominantly inattentive type Assessment & Plan: No meds at this time Hx of vyvanse - did not like Interviewing for job in [employer] Pt plans to reach out if ADHD worsens once she starts job and needs medication assistance   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 Declines PAP, has never been sexually active - risks and benefits discussed.   Return in about 6 months (around 04/09/2024) for annual physical.   I, Curtis DELENA Boom, FNP, have reviewed all documentation for this visit. The documentation on 10/08/23 for the exam, diagnosis, procedures, and orders are all accurate and complete.   Curtis DELENA Boom, FNP

## 2023-10-08 NOTE — Assessment & Plan Note (Signed)
 Recommend an annual BMP and lipid panel Encouraged Exercise 150 minutes weekly Well balanced diet

## 2023-10-08 NOTE — Assessment & Plan Note (Signed)
 No meds at this time Hx of vyvanse - did not like Interviewing for job in [employer] Pt plans to reach out if ADHD worsens once she starts job and needs medication assistance

## 2023-10-21 IMAGING — CR DG ABDOMEN 2V
2 series · 2 of 2 positions shown · non-contrast
Comparison: None Available.

CLINICAL DATA: Abdominal pain.  Injury.

EXAM:
ABDOMEN - 2 VIEW

[abdomen erect]
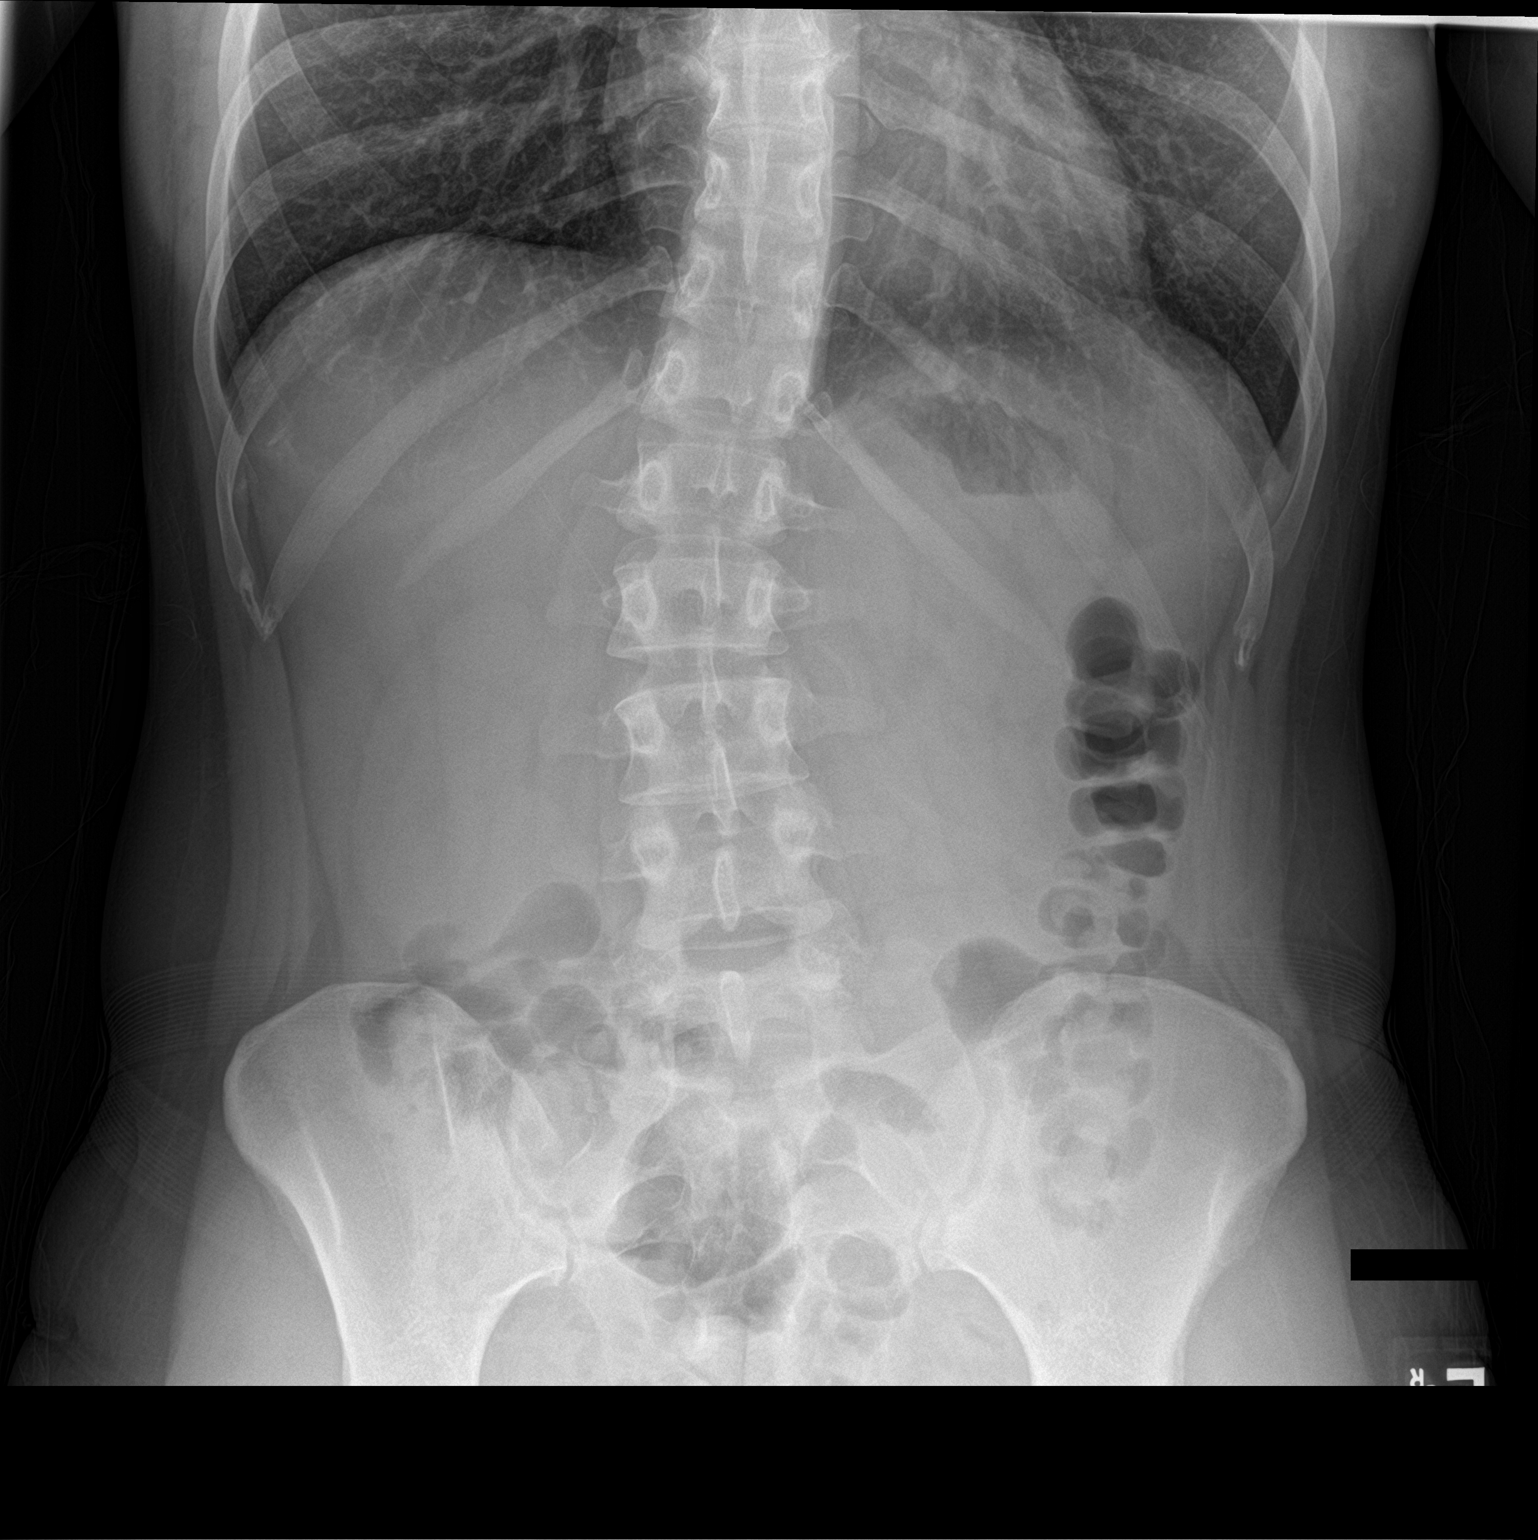

[abdomen supine]
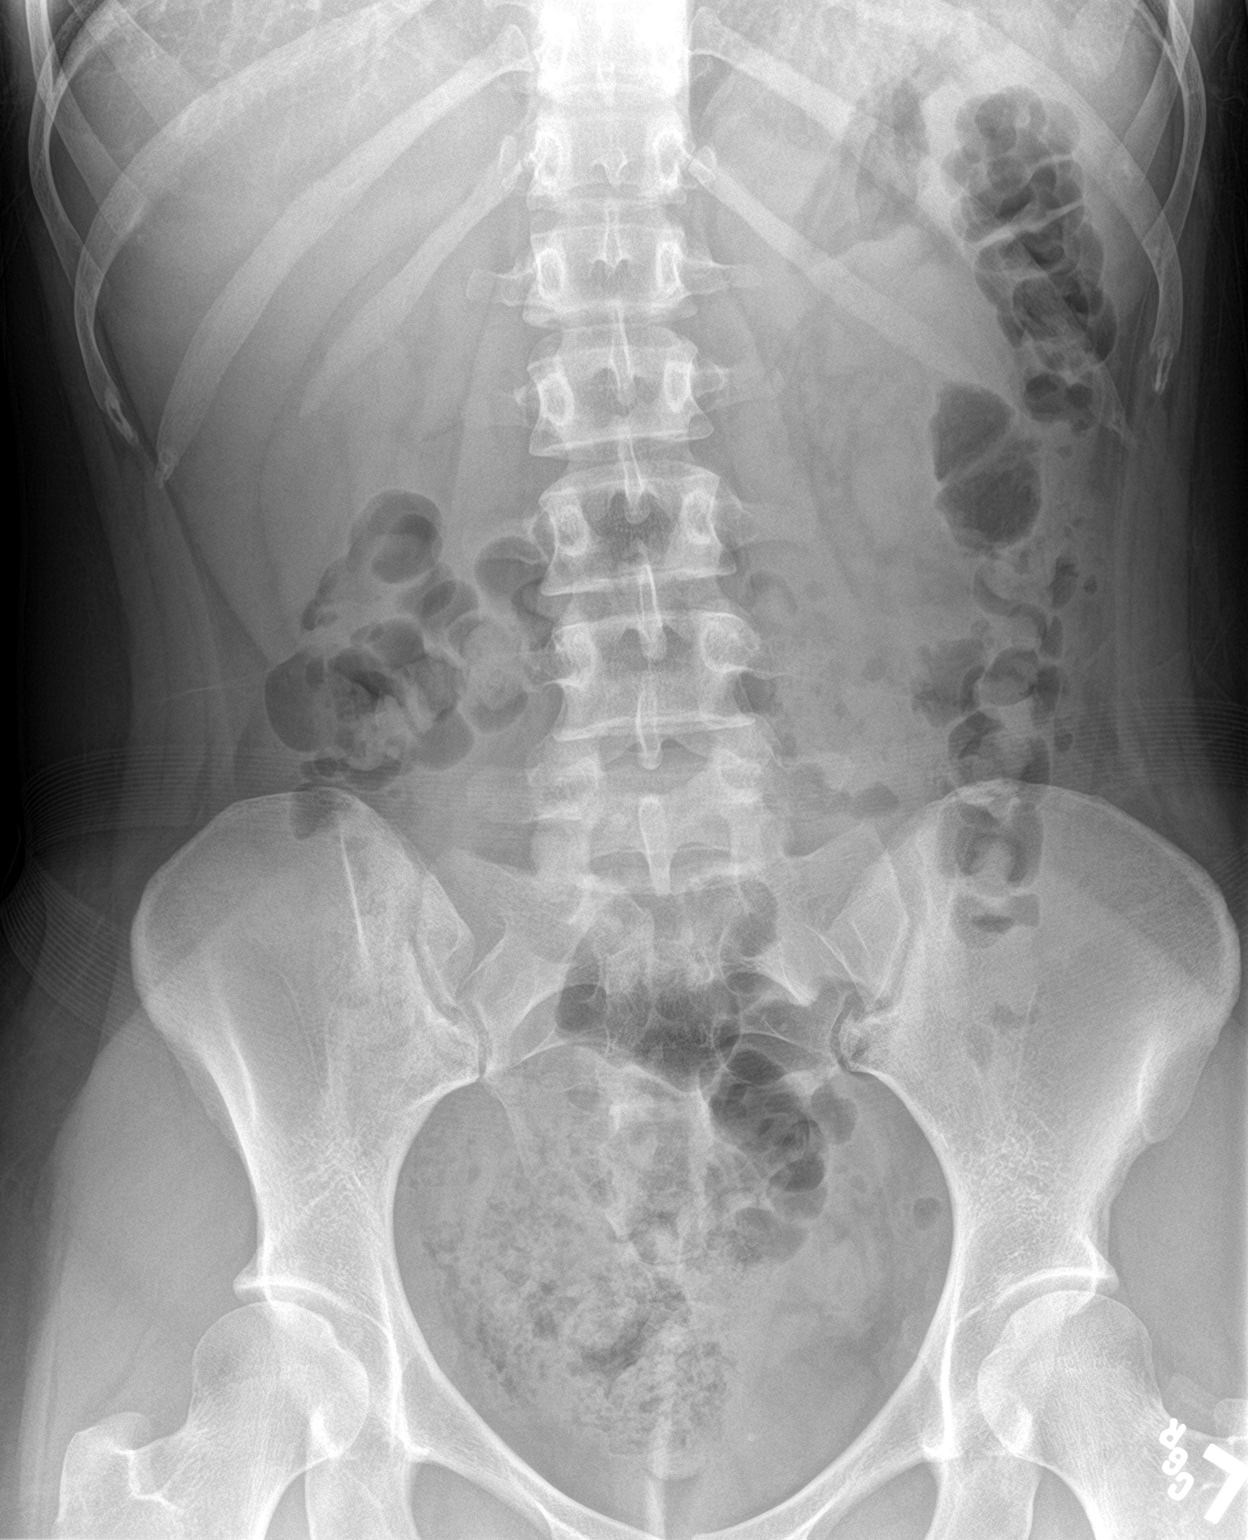

[2 of 2 positions shown; findings below may reference images not displayed]

FINDINGS: Curvature of the thoracolumbar spine, apex to the right. Bones are
otherwise unremarkable.

No free air, portal venous gas, or pneumatosis. Mild fecal loading
in the rectum. No other abnormalities are identified.
IMPRESSION: Mild fecal loading in the rectum. No other significant
abnormalities.

## 2023-10-25 ENCOUNTER — Other Ambulatory Visit: Payer: Self-pay

## 2023-10-25 DIAGNOSIS — D72829 Elevated white blood cell count, unspecified: Secondary | ICD-10-CM | POA: Insufficient documentation

## 2023-10-25 DIAGNOSIS — R109 Unspecified abdominal pain: Secondary | ICD-10-CM | POA: Diagnosis present

## 2023-10-25 DIAGNOSIS — N83202 Unspecified ovarian cyst, left side: Secondary | ICD-10-CM | POA: Insufficient documentation

## 2023-10-25 LAB — CBC
HCT: 40.2 % (ref 36.0–46.0)
Hemoglobin: 13.7 g/dL (ref 12.0–15.0)
MCH: 29.8 pg (ref 26.0–34.0)
MCHC: 34.1 g/dL (ref 30.0–36.0)
MCV: 87.4 fL (ref 80.0–100.0)
Platelets: 273 K/uL (ref 150–400)
RBC: 4.6 MIL/uL (ref 3.87–5.11)
RDW: 11.4 % — ABNORMAL LOW (ref 11.5–15.5)
WBC: 11.8 K/uL — ABNORMAL HIGH (ref 4.0–10.5)
nRBC: 0 % (ref 0.0–0.2)

## 2023-10-25 LAB — COMPREHENSIVE METABOLIC PANEL WITH GFR
ALT: 14 U/L (ref 0–44)
AST: 16 U/L (ref 15–41)
Albumin: 3.9 g/dL (ref 3.5–5.0)
Alkaline Phosphatase: 61 U/L (ref 38–126)
Anion gap: 11 (ref 5–15)
BUN: 10 mg/dL (ref 6–20)
CO2: 24 mmol/L (ref 22–32)
Calcium: 8.8 mg/dL — ABNORMAL LOW (ref 8.9–10.3)
Chloride: 104 mmol/L (ref 98–111)
Creatinine, Ser: 0.66 mg/dL (ref 0.44–1.00)
GFR, Estimated: >60 mL/min (ref >60)
Glucose, Bld: 100 mg/dL — ABNORMAL HIGH (ref 70–99)
Potassium: 3.5 mmol/L (ref 3.5–5.1)
Sodium: 139 mmol/L (ref 135–145)
Total Bilirubin: 0.8 mg/dL (ref 0.0–1.2)
Total Protein: 7.7 g/dL (ref 6.5–8.1)

## 2023-10-25 LAB — LIPASE, BLOOD: Lipase: 41 U/L (ref 11–51)

## 2023-10-25 NOTE — ED Triage Notes (Signed)
 Pt to ed from home via POV for abd cramping under my right rib cage x 1 week with lower back pain that started today. Pt is caox4, in no acute distress and ambulatory in triage.

## 2023-10-26 ENCOUNTER — Emergency Department

## 2023-10-26 ENCOUNTER — Emergency Department
Admission: EM | Admit: 2023-10-26 | Discharge: 2023-10-26 | Disposition: A | Discharge status: Home / Self Care | Attending: Emergency Medicine | Admitting: Emergency Medicine

## 2023-10-26 DIAGNOSIS — N83202 Unspecified ovarian cyst, left side: Secondary | ICD-10-CM

## 2023-10-26 DIAGNOSIS — R109 Unspecified abdominal pain: Secondary | ICD-10-CM

## 2023-10-26 LAB — URINALYSIS, ROUTINE W REFLEX MICROSCOPIC
Bilirubin Urine: NEGATIVE
Glucose, UA: NEGATIVE mg/dL
Hgb urine dipstick: NEGATIVE
Ketones, ur: NEGATIVE mg/dL
Leukocytes,Ua: NEGATIVE
Nitrite: NEGATIVE
Protein, ur: NEGATIVE mg/dL
Specific Gravity, Urine: 1.025 (ref 1.005–1.030)
pH: 5 (ref 5.0–8.0)

## 2023-10-26 LAB — POC URINE PREG, ED: Preg Test, Ur: NEGATIVE

## 2023-10-26 MED ORDER — ONDANSETRON 4 MG PO TBDP
4.0000 mg | ORAL_TABLET | Freq: Three times a day (TID) | ORAL | Status: DC | PRN
  Administered 2023-10-26: 4 mg via ORAL
  Filled 2023-10-26: qty 1

## 2023-10-26 MED ORDER — ONDANSETRON 4 MG PO TBDP: 4.0000 mg | ORAL_TABLET | Freq: Three times a day (TID) | ORAL | 0 refills | Status: DC | PRN

## 2023-10-26 NOTE — ED Provider Notes (Addendum)
 [facility] Provider Note    Event Date/Time   First MD Initiated Contact with Patient 10/26/23 0106     (approximate)   History   Abdominal Cramping   HPI  Melissa Whitehead is a 23 y.o. female   Past medical history of otherwise healthy young woman who presents to the emergency department with right upper quadrant pain, crampy, postprandial, worse with greasy meals, over the last 1 week.  Radiation to her back.  Nausea and no vomiting.  Bowel movements normal.  No urinary symptoms.  No surgical history.  No other acute medical complaints.  Independent Historian contributed to assessment above: Mother corroborates information past medical history as above  External Medical Documents Reviewed: Family medicine note from the summer      Physical Exam   Triage Vital Signs: ED Triage Vitals  Encounter Vitals Group     BP 10/25/23 2039 115/63     Girls Systolic BP Percentile --      Girls Diastolic BP Percentile --      Boys Systolic BP Percentile --      Boys Diastolic BP Percentile --      Pulse Rate 10/25/23 2039 78     Resp 10/25/23 2039 16     Temp 10/25/23 2039 98.1 F (36.7 C)     Temp Source 10/25/23 2039 Oral     SpO2 10/25/23 2039 100 %     Weight --      Height 10/25/23 2040 5' 1 (1.549 m)     Head Circumference --      Peak Flow --      Pain Score 10/25/23 2040 2     Pain Loc --      Pain Education --      Exclude from Growth Chart --     Most recent vital signs: Vitals:   10/25/23 2039 10/25/23 2350  BP: 115/63 107/72  Pulse: 78 71  Resp: 16 17  Temp: 98.1 F (36.7 C)   SpO2: 100% 97%    General: Awake, no distress.  CV:  Good peripheral perfusion.  Resp:  Normal effort.  Abd:  No distention.  Other:  Nonperitoneal exam, no rigidity or guarding but mild right upper quadrant tenderness to palpation.  No CVA tenderness.  No fever, hemodynamics appropriate and reassuring.   ED Results / Procedures / Treatments    Labs (all labs ordered are listed, but only abnormal results are displayed) Labs Reviewed  COMPREHENSIVE METABOLIC PANEL WITH GFR - Abnormal; Notable for the following components:      Result Value   Glucose, Bld 100 (*)    Calcium 8.8 (*)    All other components within normal limits  CBC - Abnormal; Notable for the following components:   WBC 11.8 (*)    RDW 11.4 (*)    All other components within normal limits  URINALYSIS, ROUTINE W REFLEX MICROSCOPIC - Abnormal; Notable for the following components:   Color, Urine YELLOW (*)    APPearance HAZY (*)    All other components within normal limits  LIPASE, BLOOD  POC URINE PREG, ED     I ordered and reviewed the above labs they are notable for white blood cell, oddly elevated 11.8, LFTs within normal limit, lipase within normal limit.   RADIOLOGY I independently reviewed and interpreted abdominal ultrasound and see no evidence of cholecystitis I also reviewed radiologist's formal read.   PROCEDURES:  Critical Care performed: No  Procedures  MEDICATIONS ORDERED IN ED: Medications  ondansetron  (ZOFRAN -ODT) disintegrating tablet 4 mg (4 mg Oral Given 10/26/23 0218)    IMPRESSION / MDM / ASSESSMENT AND PLAN / ED COURSE  I reviewed the triage vital signs and the nursing notes.                                Patient's presentation is most consistent with acute presentation with potential threat to life or bodily function.  Differential diagnosis includes, but is not limited to, biliary colic, gallstones, cholecystitis, choledocholithiasis, pancreatitis, GERD/gastritis/ulcer, renal colic or nephrolithiasis, urinary tract infection, - - pregnancy related complication ectopic pregnancy ovarian torsion, appendicitis considered but less likely   MDM:    Her symptoms are most consistent with biliary colic.  She has a mild leukocytosis, otherwise a nonperitoneal exam and LFTs normal I do think it important to check for  gallstones or cholecystitis with an ultrasound.  No pain currently, no nausea so nausea medications and pain medications ordered for as needed.   Right upper quadrant ultrasound is unremarkable.  Will proceed with CT of the abdomen.  If no marked abnormalities on CT then plan will be for discharge.  Patient reassessed and in stable condition pending her CT scan.  CT scan unremarkable for the right upper quadrant pains the patient is experiencing, incidental finding of a left ovarian cyst noted.  She has no pain in this area.  Examination did not elicit any left-sided lower quadrant pelvic pain.  I think this is an incidental finding and does not relate to her pain today, certainly very low clinical suspicion for ectopic pregnancy (negative pregnancy) or torsion.  I will give her the number to call for [facility] for follow-up for the cyst as needed.  Given her stability in the emergency department no other concerning findings to explain her right upper quadrant pain, patient to be discharged now and follow-up with PMD/gynecology/return with any new or worsening symptoms.      FINAL CLINICAL IMPRESSION(S) / ED DIAGNOSES   Final diagnoses:  Abdominal cramping  Left ovarian cyst     Rx / DC Orders   ED Discharge Orders          Ordered    ondansetron  (ZOFRAN -ODT) 4 MG disintegrating tablet  Every 8 hours PRN        10/26/23 0250             Note:  This document was prepared using Dragon voice recognition software and may include unintentional dictation errors.    Cyrena Mylar, MD 10/26/23 9748    Cyrena Mylar, MD 10/26/23 [phone]

## 2023-10-26 NOTE — Discharge Instructions (Addendum)
 Fortunately your evaluation in the emergency department did not show any emergency conditions that would require hospitalization or surgeries at this time.  You did have a incidental finding that I do not think relates to your right upper belly pain today.  This finding was that you had an ovary cyst on your left lower side.  Please call [facility] for a follow-up appointment to discuss your ovarian cyst and to establish care.  Take Zofran  for nausea as needed.  Take acetaminophen 650 mg and ibuprofen 400 mg every 6 hours for pain.  Take with food. Thank you for choosing us  for your health care today!  Please see your primary doctor this week for a follow up appointment.   If you have any new, worsening, or unexpected symptoms call your doctor right away or come back to the emergency department for reevaluation.  It was my pleasure to care for you today.   Ginnie EDISON Cyrena, MD

## 2023-10-27 ENCOUNTER — Encounter: Payer: Self-pay | Admitting: Family Medicine

## 2023-10-27 ENCOUNTER — Ambulatory Visit: Admitting: Family Medicine

## 2023-10-27 VITALS — BP 113/76 | HR 77 | Ht 61.0 in | Wt 138.0 lb

## 2023-10-27 DIAGNOSIS — R1011 Right upper quadrant pain: Secondary | ICD-10-CM

## 2023-10-27 NOTE — Progress Notes (Signed)
 Acute visit   Patient: Melissa Whitehead   DOB: 22-Feb-2001   23 y.o. Female  MRN: [id number] PCP: Wellington Curtis LABOR, FNP   Chief Complaint  Patient presents with   ED Follow Up     She was seen for abdominal R side pain. The last time this occurred there was some irritation in between the her stomach and intestines. But when the ultrasound result came back of the gallbladder and liver it was all clear she was also told her white blood count was a little elevated as well    Subjective    Discussed the use of AI scribe software for clinical note transcription with the patient, who gave verbal consent to proceed.  History of Present Illness   Melissa Whitehead is a 23 year old female who presents with right upper quadrant pain.  She presents for follow-up after an emergency department visit yesterday for right upper quadrant pain. The pain is crampy, postprandial, and worsens with greasy meals, occurring intermittently over the past week. Associated symptoms include nausea and radiation of pain to her back. There are no changes in bowel habits or vomiting.  In the emergency department, a right upper quadrant ultrasound and CT scan were unremarkable. A left ovarian cyst was incidentally noted, but she has no pain in that area. She was discharged with Zofran  and a negative pregnancy test. Her white blood cell count was slightly elevated at 11.8, with normal liver function tests and lipase.  She has experienced similar episodes of right upper quadrant pain twice before. The first episode occurred approximately two years ago and was treated with antibiotics for irritation between the stomach and small intestine. The second episode was in August of the previous year, managed with dietary changes. Currently, the pain persists with fluctuations in intensity, sometimes becoming very annoying. Eating, particularly greasy foods, can trigger the pain. She is concerned about the recurrent nature  of the pain.        Review of Systems  Objective    BP 113/76   Pulse 77   Ht 5' 1 (1.549 m)   Wt 138 lb (62.6 kg)   LMP 09/24/2023 (Approximate)   SpO2 98%   BMI 26.07 kg/m  Physical Exam Vitals reviewed.  Constitutional:      General: She is not in acute distress.    Appearance: Normal appearance. She is well-developed. She is not diaphoretic.  HENT:     Head: Normocephalic and atraumatic.  Eyes:     General: No scleral icterus.    Conjunctiva/sclera: Conjunctivae normal.  Neck:     Thyroid: No thyromegaly.  Cardiovascular:     Rate and Rhythm: Normal rate and regular rhythm.     Heart sounds: Normal heart sounds. No murmur heard. Pulmonary:     Effort: Pulmonary effort is normal. No respiratory distress.     Breath sounds: Normal breath sounds. No wheezing, rhonchi or rales.  Abdominal:     General: Bowel sounds are normal. There is no distension.     Tenderness: There is abdominal tenderness (RUQ). There is guarding (voluntary). There is no rebound.     Comments: Slight Murphys sign  Musculoskeletal:     Cervical back: Neck supple.     Right lower leg: No edema.     Left lower leg: No edema.  Lymphadenopathy:     Cervical: No cervical adenopathy.  Skin:    General: Skin is warm and dry.  Findings: No rash.  Neurological:     Mental Status: She is alert and oriented to person, place, and time. Mental status is at baseline.  Psychiatric:        Mood and Affect: Mood normal.        Behavior: Behavior normal.       No results found for any visits on 10/27/23.  Assessment & Plan     Problem List Items Addressed This Visit   None Visit Diagnoses       Postprandial RUQ pain    -  Primary   Relevant Orders   NM Hepato W/Eject Fract           Recurrent right upper quadrant abdominal pain, likely biliary in origin Intermittent right upper quadrant abdominal pain, crampy, postprandial, worse with greasy meals, with radiation to the back. Pain has  been recurrent over the past three summers. Previous imaging including right upper quadrant ultrasound and CT scan were unremarkable for gallstones or inflammation. Previous diagnosis of enteritis was made during the first episode, but current symptoms are suggestive of biliary origin. Differential includes gallbladder dysfunction without stones. Slight Murphy's sign noted on examination. White blood cell count slightly elevated but not concerning. - Order HIDA scan to assess gallbladder function. Informed consent provided regarding the possibility of experiencing pain during the procedure as it may replicate the symptoms. - Advise avoidance of greasy foods and follow a bland diet. Send dietary guidance to MyChart. - Schedule follow-up to discuss HIDA scan results.       No orders of the defined types were placed in this encounter.    Return if symptoms worsen or fail to improve.      Jon Eva, MD  [facility] [phone] (phone) [phone] (fax)  [facility]

## 2023-11-03 ENCOUNTER — Encounter: Payer: Self-pay | Admitting: Family Medicine

## 2023-11-17 ENCOUNTER — Other Ambulatory Visit

## 2023-11-18 ENCOUNTER — Ambulatory Visit
Admission: RE | Admit: 2023-11-18 | Discharge: 2023-11-18 | Disposition: A | Discharge status: Home / Self Care | Source: Ambulatory Visit | Attending: Family Medicine | Admitting: Family Medicine

## 2023-11-18 DIAGNOSIS — R1011 Right upper quadrant pain: Secondary | ICD-10-CM | POA: Insufficient documentation

## 2023-11-18 MED ORDER — TECHNETIUM TC 99M MEBROFENIN IV KIT
5.0900 | PACK | Freq: Once | INTRAVENOUS | Status: AC | PRN
  Administered 2023-11-18: 5.09 via INTRAVENOUS

## 2023-11-19 ENCOUNTER — Ambulatory Visit: Payer: Self-pay | Admitting: Family Medicine

## 2024-01-13 ENCOUNTER — Ambulatory Visit: Admitting: Family Medicine

## 2024-01-13 ENCOUNTER — Encounter: Payer: Self-pay | Admitting: Family Medicine

## 2024-01-13 VITALS — BP 102/68 | HR 72 | Resp 14 | Ht 61.0 in | Wt 139.8 lb

## 2024-01-13 DIAGNOSIS — R21 Rash and other nonspecific skin eruption: Secondary | ICD-10-CM | POA: Diagnosis not present

## 2024-01-13 DIAGNOSIS — M79622 Pain in left upper arm: Secondary | ICD-10-CM | POA: Diagnosis not present

## 2024-01-13 DIAGNOSIS — L309 Dermatitis, unspecified: Secondary | ICD-10-CM | POA: Diagnosis not present

## 2024-01-13 MED ORDER — DOXYCYCLINE HYCLATE 100 MG PO TABS: 100.0000 mg | ORAL_TABLET | Freq: Two times a day (BID) | ORAL | 0 refills | Status: AC

## 2024-01-13 MED ORDER — TRIAMCINOLONE ACETONIDE 0.1 % EX CREA: 1.0000 | TOPICAL_CREAM | Freq: Two times a day (BID) | CUTANEOUS | 1 refills | Status: AC

## 2024-01-13 NOTE — Progress Notes (Signed)
 Acute Office Visit  Introduced to nurse practitioner role and practice setting.  All questions answered.  Discussed provider/patient relationship and expectations.   Subjective:     Patient ID: Melissa Whitehead, female    DOB: Jan 27, 2001, 23 y.o.   MRN: [id number]  Chief Complaint  Patient presents with   Rash    L underarm rash, itching, pain x 1 week otc: ibuprofen, rash cream, topical antibacterial ointment    Discussed the use of AI scribe software for clinical note transcription with the patient, who gave verbal consent to proceed.  History of Present Illness Melissa Whitehead is a 23 year old female who presents with a painful rash under her arm.  She has had the rash for approximately two and a half weeks, located under her Left armpit. It is painful to touch and occasionally itchy, particularly when she feels pain in the area. The rash is very red, swollen, and has a different texture compared to the surrounding skin. No drainage or bleeding is noted.  She has not experienced any fever, and this is the first occurrence of such a rash. Her work involves manual labor, which she suspects might have contributed to the rash, possibly as a heat rash.  She has tried using an over-the-counter rash cream and a triple antibiotic ointment, but neither provided relief. Scrubbing the area resulted in significant pain, leading her to take ibuprofen, which offered minimal pain relief.  Her family history includes a relative with eczema, but she has not been diagnosed with this condition. .   Denies any rash under breasts, groin, or buttocks area.  Rash    Review of Systems  Skin:  Positive for rash.        Objective:    BP 102/68   Pulse 72   Resp 14   Ht 5' 1 (1.549 m)   Wt 139 lb 12.8 oz (63.4 kg)   LMP 12/14/2023   SpO2 100%   BMI 26.41 kg/m    Physical Exam Constitutional:      General: She is not in acute distress.    Appearance: Normal appearance. She  is not ill-appearing, toxic-appearing or diaphoretic.  HENT:     Head: Normocephalic.  Eyes:     Extraocular Movements: Extraocular movements intact.     Pupils: Pupils are equal, round, and reactive to light.  Cardiovascular:     Rate and Rhythm: Normal rate and regular rhythm.     Pulses: Normal pulses.     Heart sounds: Normal heart sounds. No murmur heard.    No friction rub. No gallop.  Pulmonary:     Effort: No respiratory distress.     Breath sounds: No stridor. No wheezing, rhonchi or rales.  Chest:     Chest wall: No tenderness.  Musculoskeletal:     Right lower leg: No edema.     Left lower leg: No edema.  Skin:    Capillary Refill: Capillary refill takes less than 2 seconds.     Findings: Erythema and rash present.  Neurological:     General: No focal deficit present.     Mental Status: She is alert and oriented to person, place, and time. Mental status is at baseline.  Psychiatric:        Mood and Affect: Mood normal.        Behavior: Behavior normal.        Thought Content: Thought content normal.        Judgment:  Judgment normal.        No results found for any visits on 01/13/24.      Assessment & Plan:  Assessment and Plan Assessment & Plan Excoriated rash - allergic dermatitis vs friction dermatitis vs hidradenitis suppurativa  - Presents with a painful, itchy rash under the arm for approximately two and a half weeks in L armpit.  - Rash is red, swollen, and painful to touch, with no drainage or bloodiness.  - Pain limit ROM of L arm - R armpit appears to may be developing a rash as well - does not feel fluctuant or unable to appreciate of any tunneling - new onset, does not appear to be abscess like in exam - no hx of DMII, BMI 26.41 - does have laborious job could be related to moisture build up and friction, does not appear to be fungal in nature at this time - Itching occurs primarily when the area is painful.  - Previous treatments with  over-the-counter rash cream and triple antibiotic ointment were ineffective. - Ibuprofen provided minimal pain relief.  - Will prescribe triamcinolone 1% to affected area BID, may apply aquaphor to affected area 3-4times per for excoriation.  - Will prescribed doxycycline 100mg  BID for infection prevention, potential start of HS - only use unscented soaps, detergents, and deodorant - Cotton clothing, no tight clothing - pat area dry after showers - may continue use of ibuprofen or tylenol for pain - do not scrub, could worsen excoriation - limit use of purse or straps to that area - wear proper fitting bra - If symptoms do not improved in two weeks, referral to dermatology.   Problem List Items Addressed This Visit   None Visit Diagnoses       Excoriated rash    -  Primary   Relevant Medications   triamcinolone cream (KENALOG) 0.1 %   doxycycline (VIBRA-TABS) 100 MG tablet     Pain in left axilla       Relevant Medications   triamcinolone cream (KENALOG) 0.1 %     Dermatitis       Relevant Medications   triamcinolone cream (KENALOG) 0.1 %   doxycycline (VIBRA-TABS) 100 MG tablet       Meds ordered this encounter  Medications   triamcinolone cream (KENALOG) 0.1 %    Sig: Apply 1 Application topically 2 (two) times daily. Apply to affected area in bilateral armpits    Dispense:  30 g    Refill:  1   doxycycline (VIBRA-TABS) 100 MG tablet    Sig: Take 1 tablet (100 mg total) by mouth 2 (two) times daily.    Dispense:  14 tablet    Refill:  0    Return if symptoms worsen or fail to improve.  Melissa DELENA Boom, FNP  I, Melissa DELENA Boom, FNP, have reviewed all documentation for this visit. The documentation on 01/13/24 for the exam, diagnosis, procedures, and orders are all accurate and complete.

## 2024-02-09 ENCOUNTER — Encounter: Payer: Self-pay | Admitting: Family Medicine

## 2024-02-09 ENCOUNTER — Ambulatory Visit: Admitting: Family Medicine

## 2024-02-09 VITALS — BP 96/67 | HR 76 | Temp 98.2°F | Ht 61.0 in | Wt 139.7 lb

## 2024-02-09 DIAGNOSIS — J028 Acute pharyngitis due to other specified organisms: Secondary | ICD-10-CM | POA: Diagnosis not present

## 2024-02-09 DIAGNOSIS — R1310 Dysphagia, unspecified: Secondary | ICD-10-CM

## 2024-02-09 LAB — POCT INFLUENZA A/B
Influenza A, POC: NEGATIVE
Influenza B, POC: NEGATIVE

## 2024-02-09 LAB — POC COVID19 BINAXNOW: SARS Coronavirus 2 Ag: NEGATIVE

## 2024-02-09 LAB — POCT RAPID STREP A (OFFICE): Rapid Strep A Screen: NEGATIVE

## 2024-02-09 MED ORDER — PREDNISONE 20 MG PO TABS: ORAL_TABLET | ORAL | 0 refills | Status: AC

## 2024-02-09 MED ORDER — AMOXICILLIN 875 MG PO TABS: 875.0000 mg | ORAL_TABLET | Freq: Two times a day (BID) | ORAL | 0 refills | Status: AC

## 2024-02-09 NOTE — Patient Instructions (Signed)
 To keep you healthy, please keep in mind the following health maintenance items that you are due for:   There are no preventive care reminders to display for this patient.   Best Wishes,   Dr. Lang

## 2024-02-09 NOTE — Progress Notes (Signed)
 ACUTE VISIT   Patient: Melissa Whitehead   DOB: 01-26-2001   23 y.o. Female  MRN: [id number]   PCP: Franchot Isaiah LABOR, MD  Chief Complaint  Patient presents with   Sore Throat    Sore throat x 1 week, thought it could be due to wearing mouthguard at night, stopped using with no improvement. Pain with swallowing, hoarseness, fatigued, no fever or chills.   Patient's mom looked in back of throat and saw some redness, swelling and white streak on right side near tonsil    Subjective    HPI HPI     Sore Throat    Additional comments: Sore throat x 1 week, thought it could be due to wearing mouthguard at night, stopped using with no improvement. Pain with swallowing, hoarseness, fatigued, no fever or chills.   Patient's mom looked in back of throat and saw some redness, swelling and white streak on right side near tonsil       Last edited by Cherry Chiquita HERO, CMA on 02/09/2024 10:15 AM.       Discussed the use of AI scribe software for clinical note transcription with the patient, who gave verbal consent to proceed.  History of Present Illness Melissa Whitehead is a 23 year old female who presents with a sore throat for a week.  She has been experiencing a sore throat for over a week, initially attributing it to her mouth guard use at night. Discontinuation of the mouth guard did not improve her symptoms. The pain is localized to one side of her throat and has progressively worsened, leading to difficulty swallowing and hoarseness. She also reports fatigue and increased sleep, but no fever or chills.  Her mother observed redness, swelling, and white marks in her throat. She has a history of tonsillitis treated in 2024. Recent point-of-care tests for strep, COVID-19, and influenza were negative.  She has been taking ibuprofen, which helps with swelling and irritation, and previously tried Zyrtec without relief. She has taken ibuprofen in doses of 600 mg, which provided  significant relief. 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  During the review of symptoms, she reports mild ear pain on the affected side, but no nasal symptoms. Swallowing is painful, even with saliva, but she does not feel any significant obstruction in her throat.     Medications: Outpatient Medications Prior to Visit  Medication Sig   baclofen  (LIORESAL ) 10 MG tablet Take 1 tablet (10 mg total) by mouth as needed.   doxycycline  (VIBRA -TABS) 100 MG tablet Take 1 tablet (100 mg total) by mouth 2 (two) times daily.   meclizine (ANTIVERT) 25 MG tablet Take 25 mg by mouth as needed (Only when experiencing vertigo symptoms).   triamcinolone  cream (KENALOG ) 0.1 % Apply 1 Application topically 2 (two) times daily. Apply to affected area in bilateral armpits   No facility-administered medications prior to visit.        Objective    BP 96/67 (BP Location: Right Arm, Patient Position: Sitting, Cuff Size: Normal)   Pulse 76   Temp 98.2 F (36.8 C) (Oral)   Ht 5' 1 (1.549 m)   Wt 139 lb 11.2 oz (63.4 kg)   LMP 02/01/2024 (Exact Date)   SpO2 99%   BMI 26.40 kg/m    Physical Exam   Physical Exam GENERAL: Tired appearing. HEENT: Oropharynx erythematous. Right tonsil erythematous with a small area of white substance. Right tympanic membrane without  effusion or erythema. NECK: Enlarged lymphadenopathy on right oropharynx.   Results for orders placed or performed in visit on 02/09/24  POCT rapid strep A  Result Value Ref Range   Rapid Strep A Screen Negative Negative  POC COVID-19 BinaxNow  Result Value Ref Range   SARS Coronavirus 2 Ag Negative Negative  POCT Influenza A/B  Result Value Ref Range   Influenza A, POC Negative Negative   Influenza B, POC Negative Negative    Assessment & Plan     Assessment and Plan Assessment & Plan Acute pharyngitis Erythematous oropharynx and right tonsil with a small area of white substance. Negative for  COVID, influenza, and strep. Likely viral etiology given negative tests and presentation. Symptoms include sore throat, hoarseness, fatigue, and pain with swallowing. No fever or chills. NSAIDs have provided some relief. Differential includes viral pharyngitis and tonsillitis. -recommended starting amox 875mg  BID for 7 days - Prescribed prednisone 40 mg daily for 3 days, then 20 mg daily for 3 days; counseled pt to start this prescription if patient not feeling improved after 3 doses of the Amox  - Advised continuation of NSAIDs every 4-6 hours as needed for pain. - Instructed to avoid spicy and crunchy foods. - Encouraged warm fluids for comfort. - Advised to contact provider if no improvement by Friday for potential antibiotic initiation.      Return in about 10 days (around 02/19/2024), or if symptoms worsen or fail to improve.        Rockie Agent, MD  [facility] [phone] (phone) [phone] (fax)  [facility]
